# Patient Record
Sex: Male | Born: 1990 | Race: White | Hispanic: No | Smoking: Never smoker
Health system: Southern US, Community
[De-identification: ages and names within clinical notes are randomized; demographics above are authoritative.]

---

## 2016-05-10 ENCOUNTER — Other Ambulatory Visit: Payer: Self-pay | Admitting: Internal Medicine

## 2016-05-10 DIAGNOSIS — R945 Abnormal results of liver function studies: Secondary | ICD-10-CM

## 2016-05-17 ENCOUNTER — Other Ambulatory Visit: Payer: Self-pay

## 2016-05-19 ENCOUNTER — Other Ambulatory Visit: Payer: Self-pay

## 2016-05-24 ENCOUNTER — Ambulatory Visit
Admission: RE | Admit: 2016-05-24 | Discharge: 2016-05-24 | Disposition: A | Discharge status: Home / Self Care | Payer: BLUE CROSS/BLUE SHIELD | Source: Ambulatory Visit | Attending: Internal Medicine | Admitting: Internal Medicine

## 2016-05-24 DIAGNOSIS — R945 Abnormal results of liver function studies: Secondary | ICD-10-CM

## 2016-06-28 ENCOUNTER — Other Ambulatory Visit: Payer: Self-pay | Admitting: Internal Medicine

## 2016-06-28 DIAGNOSIS — R7989 Other specified abnormal findings of blood chemistry: Secondary | ICD-10-CM

## 2016-06-28 DIAGNOSIS — R945 Abnormal results of liver function studies: Secondary | ICD-10-CM

## 2016-12-03 ENCOUNTER — Ambulatory Visit (INDEPENDENT_AMBULATORY_CARE_PROVIDER_SITE_OTHER): Payer: BLUE CROSS/BLUE SHIELD | Admitting: Podiatry

## 2016-12-03 ENCOUNTER — Encounter: Payer: Self-pay | Admitting: Podiatry

## 2016-12-03 VITALS — BP 134/81 | HR 83 | Resp 16 | Ht 75.0 in | Wt 268.0 lb

## 2016-12-03 DIAGNOSIS — L600 Ingrowing nail: Secondary | ICD-10-CM | POA: Diagnosis not present

## 2016-12-03 NOTE — Progress Notes (Signed)
   Subjective:    Patient ID: Kristopher Stephens, male    DOB: Dec 11, 1990, 26 y.o.   MRN: [id number]  HPI 26 year old male presents the office if her concerns of ingrown toenail to the left big toe which is been ongoing the last 2 weeks. She's the areas painful with pressure in shoes he has noticed a small amount of pus. Denies any redness or red streaks. He has had no recent treatment for it. No other complaints.   Review of Systems  All other systems reviewed and are negative.      Objective:   Physical Exam General: AAO x3, NAD  Dermatological: There is incurvation along the lateral aspect of the left hallux toenail tenderness palpation. This mild hyper granulation tissue present. There is tenderness along the entire nail border. Faint amount of erythema there is no ascending synovitis. There is no drainage or pus I'm able to identify site. No ascending cellulitis. No open lesions.  Vascular: Dorsalis Pedis artery and Posterior Tibial artery pedal pulses are 2/4 bilateral with immedate capillary fill time. There is no pain with calf compression, swelling, warmth, erythema.   Neruologic: Grossly intact via light touch bilateral. Vibratory intact via tuning fork bilateral. Protective threshold with Semmes Wienstein monofilament intact to all pedal sites bilateral.  Musculoskeletal: No gross boney pedal deformities bilateral. No pain, crepitus, or limitation noted with foot and ankle range of motion bilateral. Muscular strength 5/5 in all groups tested bilateral.  Gait: Unassisted, Nonantalgic.     Assessment & Plan:  26 year old male left lateral hallux ingrown toenail -Treatment options discussed including all alternatives, risks, and complications -Etiology of symptoms were discussed -At this time, the patient is requesting partial nail removal with chemical matricectomy to the symptomatic portion of the nail. Risks and complications were discussed with the patient for which they  understand and  verbally consent to the procedure. Under sterile conditions a total of 3 mL of a mixture of 2% lidocaine plain and 0.5% Marcaine plain was infiltrated in a hallux block fashion. Once anesthetized, the skin was prepped in sterile fashion. A tourniquet was then applied. Next the lateral aspect of hallux nail border was then sharply excised making sure to remove the entire offending nail border. Once the nails were ensured to be removed area was debrided and the underlying skin was intact. There is no purulence identified in the procedure. Next phenol was then applied under standard conditions and copiously irrigated. Silvadene was applied. A dry sterile dressing was applied. After application of the dressing the tourniquet was removed and there is found to be an immediate capillary refill time to the digit. The patient tolerated the procedure well any complications. Post procedure instructions were discussed the patient for which he verbally understood. Follow-up in one week for nail check or sooner if any problems are to arise. Discussed signs/symptoms of infection and directed to call the office immediately should any occur or go directly to the emergency room. In the meantime, encouraged to call the office with any questions, concerns, changes symptoms.  Kristopher Stephens, DPM

## 2016-12-03 NOTE — Patient Instructions (Signed)

## 2018-02-06 IMAGING — US US ABDOMEN COMPLETE
1 series · 14 of 25 positions shown · non-contrast
Comparison: None.

CLINICAL DATA: Elevated liver function tests.

EXAM:
ABDOMEN ULTRASOUND COMPLETE

[Series 1: us abdomen complete · 0.24mm/px · 14 of 77 slices shown]
[im 1/77]
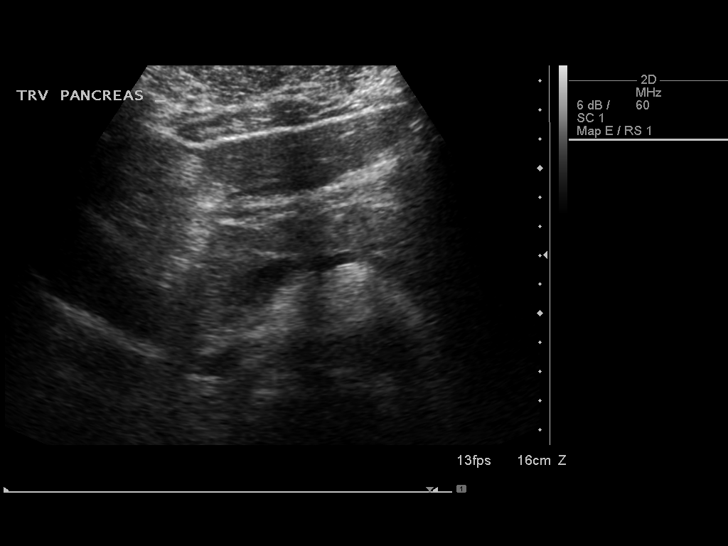
[im 7/77]
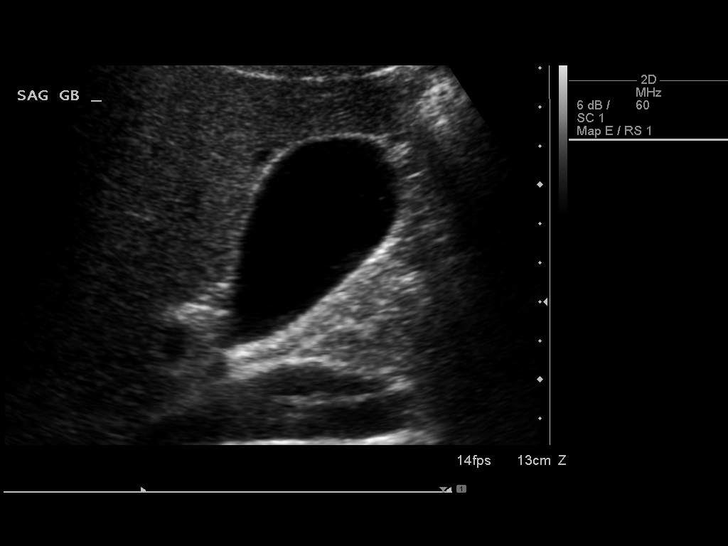
[im 13/77]
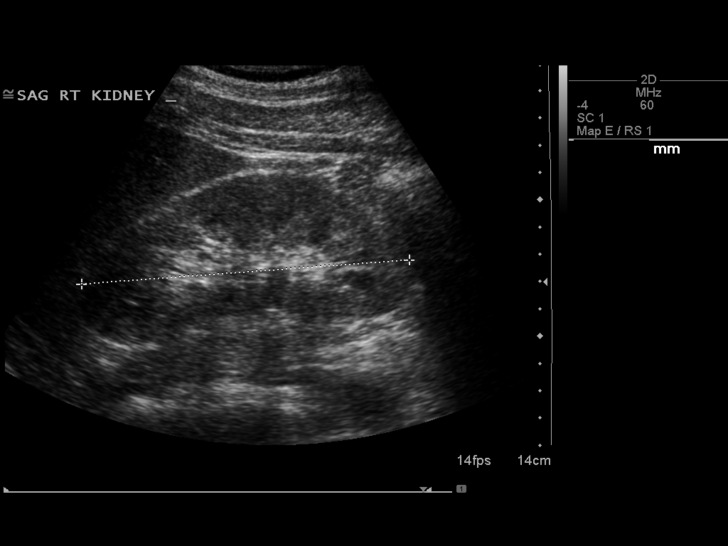
[im 20/77]
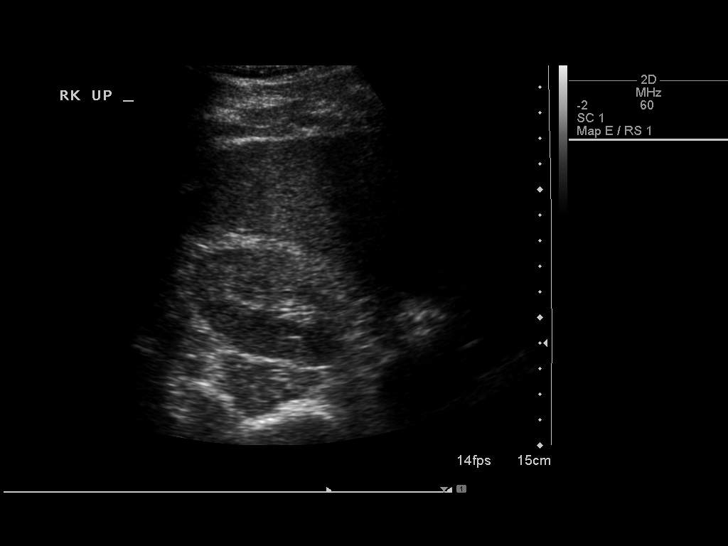
[im 26/77]
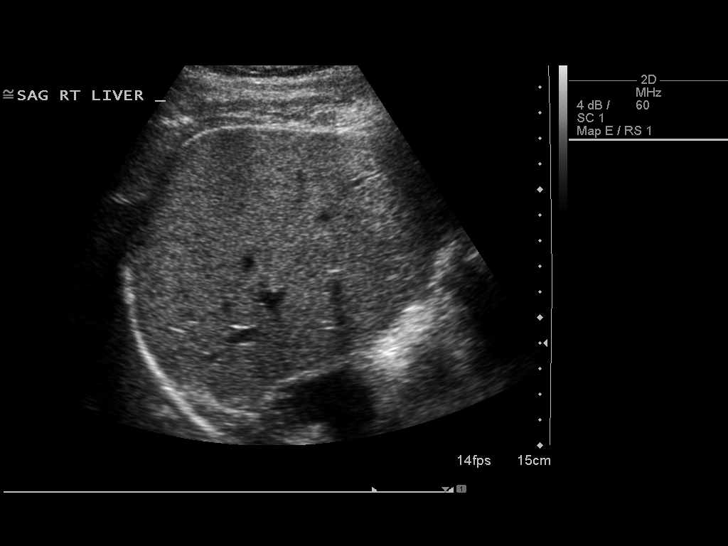
[im 29/77]
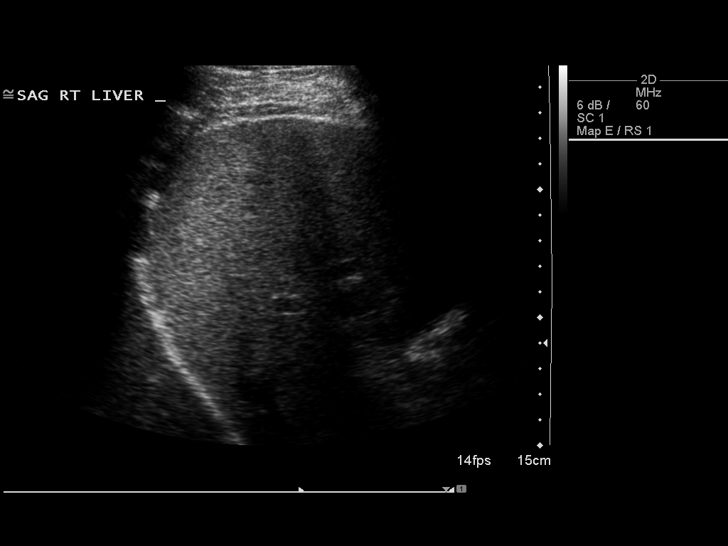
[im 35/77]
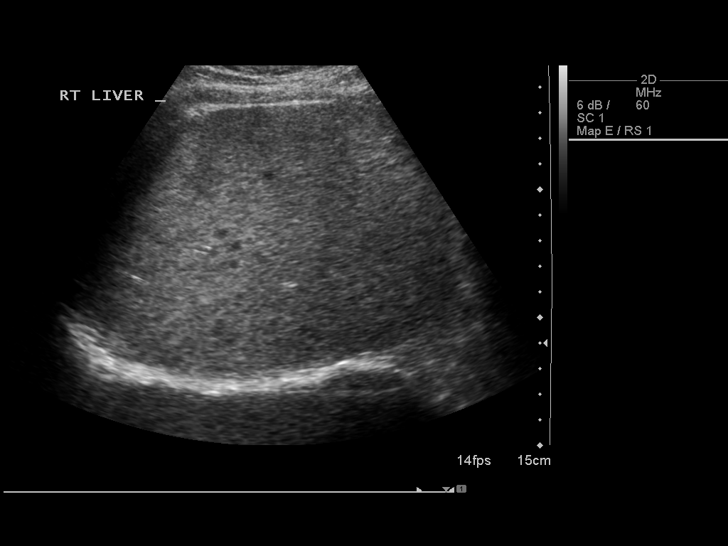
[im 42/77]
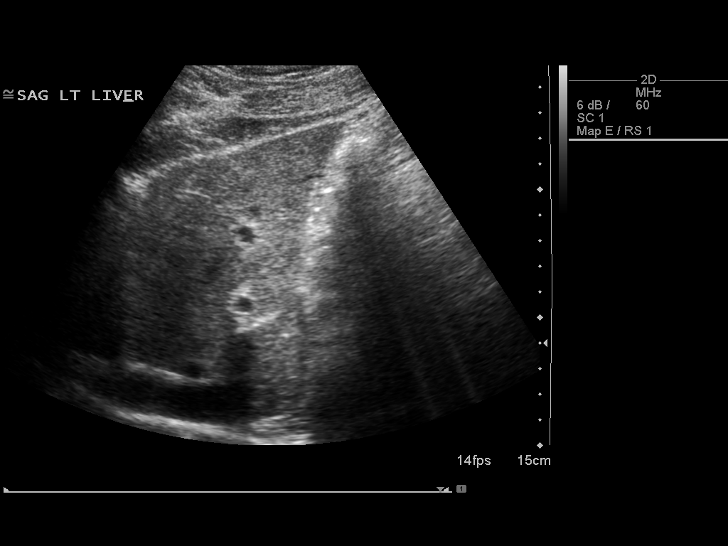
[im 48/77]
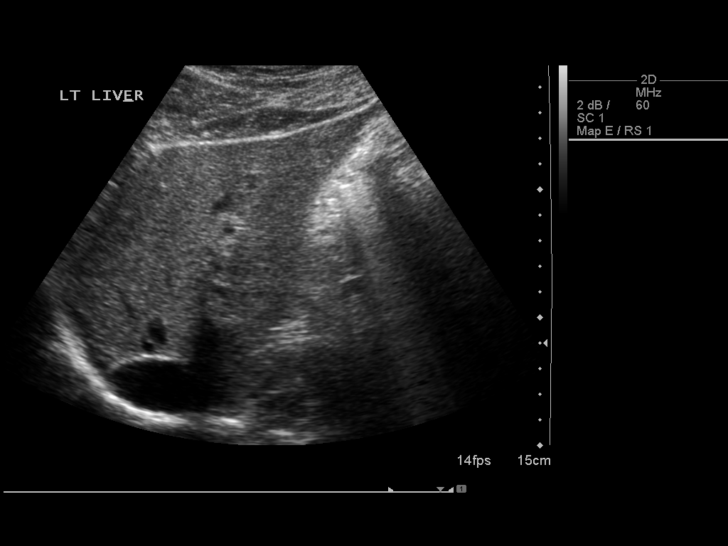
[im 51/77]
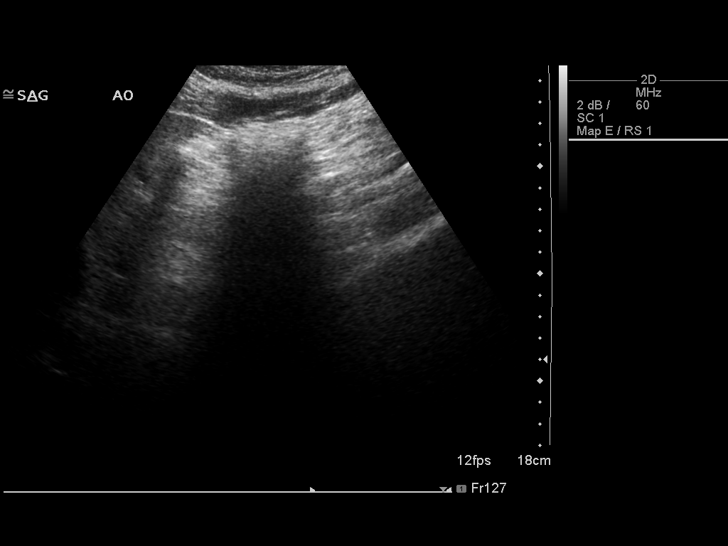
[im 58/77]
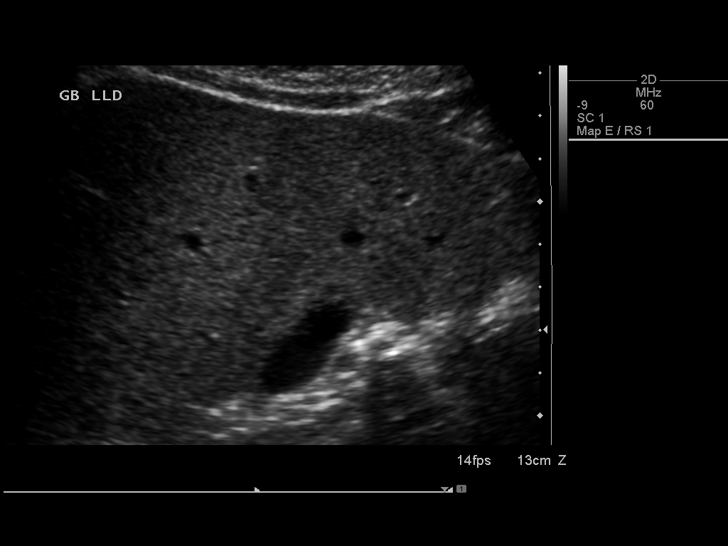
[im 64/77]
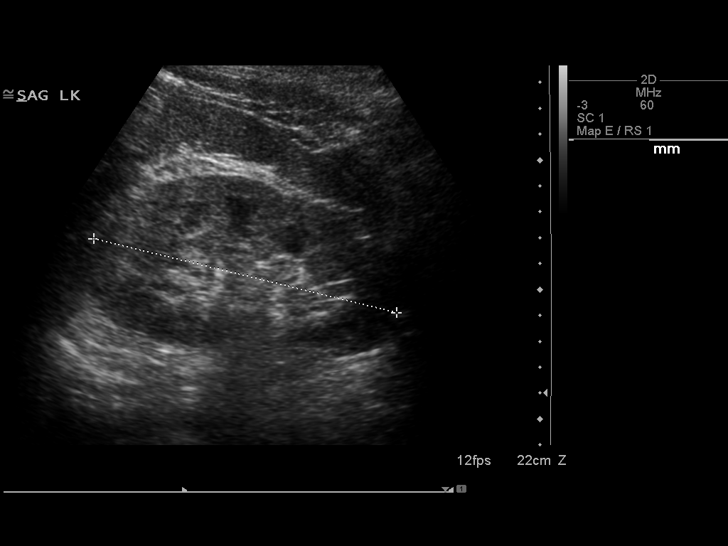
[im 70/77]
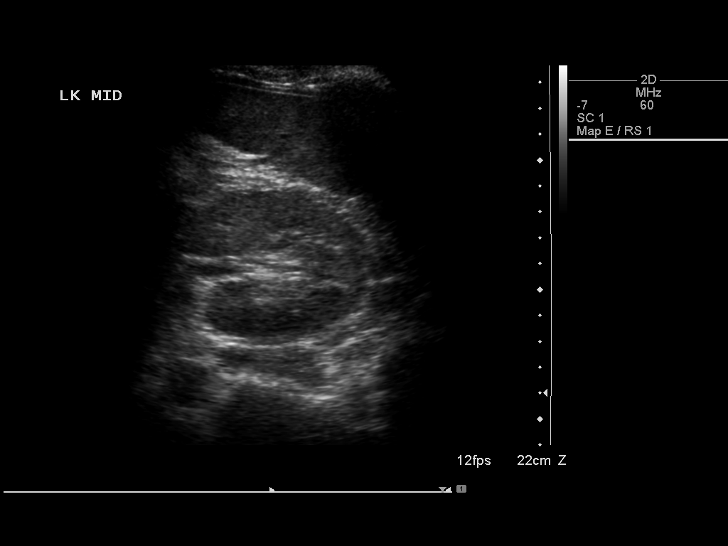
[im 77/77]
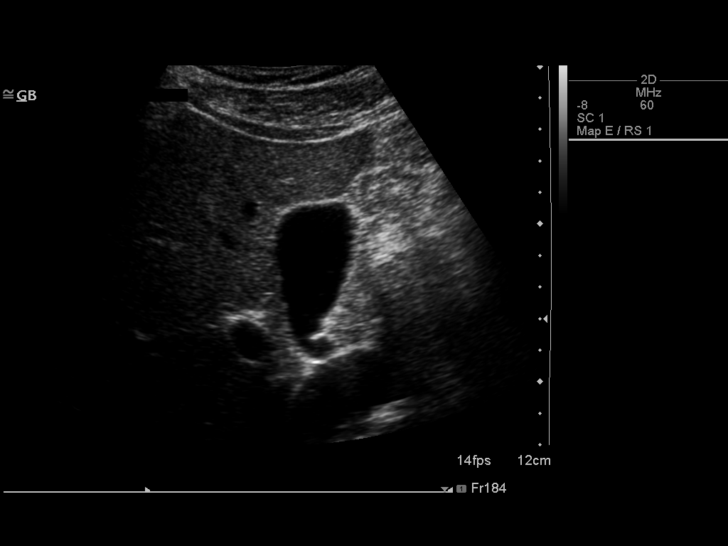

[14 of 25 positions shown; findings below may reference images not displayed]

FINDINGS: Gallbladder: No gallstones or wall thickening visualized. No
sonographic Murphy sign noted by sonographer.

Common bile duct: Diameter: 5.2 mm

Liver: Heterogeneous echogenicity with generalized increased
echogenicity. No mass or focal lesion.

IVC: No abnormality visualized.

Pancreas: Portions visualized are unremarkable. Tail obscured by
bowel gas.

Spleen: Size and appearance within normal limits.

Right Kidney: Length: 13.0 cm. Echogenicity within normal limits. No
mass or hydronephrosis visualized.

Left Kidney: Length: 12.3 cm. Echogenicity within normal limits. No
mass or hydronephrosis visualized.

Abdominal aorta: No aneurysm visualized.

Other findings: None.
IMPRESSION: 1. No acute findings.  Normal gallbladder.  No bile duct dilation.
2. Heterogeneous echogenicity of the liver with generalized
increased echogenicity. This is most likely due to hepatic
steatosis.
3. No other abnormalities.

## 2023-03-24 DIAGNOSIS — M25511 Pain in right shoulder: Secondary | ICD-10-CM | POA: Diagnosis not present

## 2023-04-01 DIAGNOSIS — R945 Abnormal results of liver function studies: Secondary | ICD-10-CM | POA: Diagnosis not present

## 2023-04-01 DIAGNOSIS — Z1331 Encounter for screening for depression: Secondary | ICD-10-CM | POA: Diagnosis not present

## 2023-04-01 DIAGNOSIS — K219 Gastro-esophageal reflux disease without esophagitis: Secondary | ICD-10-CM | POA: Diagnosis not present

## 2023-04-05 ENCOUNTER — Ambulatory Visit: Payer: BLUE CROSS/BLUE SHIELD | Admitting: Family Medicine

## 2023-04-06 ENCOUNTER — Ambulatory Visit: Payer: BLUE CROSS/BLUE SHIELD | Admitting: Family Medicine

## 2023-04-13 DIAGNOSIS — Z23 Encounter for immunization: Secondary | ICD-10-CM | POA: Diagnosis not present

## 2023-04-18 DIAGNOSIS — F419 Anxiety disorder, unspecified: Secondary | ICD-10-CM | POA: Diagnosis not present

## 2023-04-22 DIAGNOSIS — F4323 Adjustment disorder with mixed anxiety and depressed mood: Secondary | ICD-10-CM | POA: Diagnosis not present

## 2023-04-29 DIAGNOSIS — F4323 Adjustment disorder with mixed anxiety and depressed mood: Secondary | ICD-10-CM | POA: Diagnosis not present

## 2023-05-03 DIAGNOSIS — F4323 Adjustment disorder with mixed anxiety and depressed mood: Secondary | ICD-10-CM | POA: Diagnosis not present

## 2023-05-09 DIAGNOSIS — F419 Anxiety disorder, unspecified: Secondary | ICD-10-CM | POA: Diagnosis not present

## 2023-05-11 DIAGNOSIS — F4323 Adjustment disorder with mixed anxiety and depressed mood: Secondary | ICD-10-CM | POA: Diagnosis not present

## 2023-05-20 DIAGNOSIS — F4323 Adjustment disorder with mixed anxiety and depressed mood: Secondary | ICD-10-CM | POA: Diagnosis not present

## 2023-05-27 DIAGNOSIS — F4323 Adjustment disorder with mixed anxiety and depressed mood: Secondary | ICD-10-CM | POA: Diagnosis not present

## 2023-05-30 DIAGNOSIS — F4323 Adjustment disorder with mixed anxiety and depressed mood: Secondary | ICD-10-CM | POA: Diagnosis not present

## 2023-06-03 DIAGNOSIS — F4323 Adjustment disorder with mixed anxiety and depressed mood: Secondary | ICD-10-CM | POA: Diagnosis not present

## 2023-06-08 DIAGNOSIS — F4323 Adjustment disorder with mixed anxiety and depressed mood: Secondary | ICD-10-CM | POA: Diagnosis not present

## 2023-06-17 DIAGNOSIS — F4323 Adjustment disorder with mixed anxiety and depressed mood: Secondary | ICD-10-CM | POA: Diagnosis not present

## 2023-06-21 DIAGNOSIS — J0100 Acute maxillary sinusitis, unspecified: Secondary | ICD-10-CM | POA: Diagnosis not present

## 2023-06-22 DIAGNOSIS — F4323 Adjustment disorder with mixed anxiety and depressed mood: Secondary | ICD-10-CM | POA: Diagnosis not present

## 2023-06-28 DIAGNOSIS — F4323 Adjustment disorder with mixed anxiety and depressed mood: Secondary | ICD-10-CM | POA: Diagnosis not present

## 2023-07-01 DIAGNOSIS — F4323 Adjustment disorder with mixed anxiety and depressed mood: Secondary | ICD-10-CM | POA: Diagnosis not present

## 2023-07-08 DIAGNOSIS — F4323 Adjustment disorder with mixed anxiety and depressed mood: Secondary | ICD-10-CM | POA: Diagnosis not present

## 2023-07-15 DIAGNOSIS — F4323 Adjustment disorder with mixed anxiety and depressed mood: Secondary | ICD-10-CM | POA: Diagnosis not present

## 2023-07-19 DIAGNOSIS — F4323 Adjustment disorder with mixed anxiety and depressed mood: Secondary | ICD-10-CM | POA: Diagnosis not present

## 2023-07-25 DIAGNOSIS — F4323 Adjustment disorder with mixed anxiety and depressed mood: Secondary | ICD-10-CM | POA: Diagnosis not present

## 2023-08-08 DIAGNOSIS — F4323 Adjustment disorder with mixed anxiety and depressed mood: Secondary | ICD-10-CM | POA: Diagnosis not present

## 2023-08-09 DIAGNOSIS — F4323 Adjustment disorder with mixed anxiety and depressed mood: Secondary | ICD-10-CM | POA: Diagnosis not present

## 2023-08-12 DIAGNOSIS — F4323 Adjustment disorder with mixed anxiety and depressed mood: Secondary | ICD-10-CM | POA: Diagnosis not present

## 2023-08-18 DIAGNOSIS — M25511 Pain in right shoulder: Secondary | ICD-10-CM | POA: Diagnosis not present

## 2023-08-19 DIAGNOSIS — F4323 Adjustment disorder with mixed anxiety and depressed mood: Secondary | ICD-10-CM | POA: Diagnosis not present

## 2023-08-26 DIAGNOSIS — F4323 Adjustment disorder with mixed anxiety and depressed mood: Secondary | ICD-10-CM | POA: Diagnosis not present

## 2023-08-31 DIAGNOSIS — F4323 Adjustment disorder with mixed anxiety and depressed mood: Secondary | ICD-10-CM | POA: Diagnosis not present

## 2023-09-06 DIAGNOSIS — F4323 Adjustment disorder with mixed anxiety and depressed mood: Secondary | ICD-10-CM | POA: Diagnosis not present

## 2023-09-09 DIAGNOSIS — M25511 Pain in right shoulder: Secondary | ICD-10-CM | POA: Diagnosis not present

## 2023-09-14 DIAGNOSIS — F4323 Adjustment disorder with mixed anxiety and depressed mood: Secondary | ICD-10-CM | POA: Diagnosis not present

## 2023-09-15 DIAGNOSIS — M75101 Unspecified rotator cuff tear or rupture of right shoulder, not specified as traumatic: Secondary | ICD-10-CM | POA: Diagnosis not present

## 2023-09-28 DIAGNOSIS — F4323 Adjustment disorder with mixed anxiety and depressed mood: Secondary | ICD-10-CM | POA: Diagnosis not present

## 2023-09-30 DIAGNOSIS — F4323 Adjustment disorder with mixed anxiety and depressed mood: Secondary | ICD-10-CM | POA: Diagnosis not present

## 2023-10-04 DIAGNOSIS — A499 Bacterial infection, unspecified: Secondary | ICD-10-CM | POA: Diagnosis not present

## 2023-10-05 DIAGNOSIS — F4323 Adjustment disorder with mixed anxiety and depressed mood: Secondary | ICD-10-CM | POA: Diagnosis not present

## 2023-10-06 DIAGNOSIS — F4323 Adjustment disorder with mixed anxiety and depressed mood: Secondary | ICD-10-CM | POA: Diagnosis not present

## 2023-10-13 DIAGNOSIS — F4323 Adjustment disorder with mixed anxiety and depressed mood: Secondary | ICD-10-CM | POA: Diagnosis not present

## 2023-10-19 DIAGNOSIS — F4323 Adjustment disorder with mixed anxiety and depressed mood: Secondary | ICD-10-CM | POA: Diagnosis not present

## 2023-10-26 DIAGNOSIS — F4323 Adjustment disorder with mixed anxiety and depressed mood: Secondary | ICD-10-CM | POA: Diagnosis not present

## 2023-11-02 DIAGNOSIS — F4323 Adjustment disorder with mixed anxiety and depressed mood: Secondary | ICD-10-CM | POA: Diagnosis not present

## 2023-11-09 DIAGNOSIS — F4323 Adjustment disorder with mixed anxiety and depressed mood: Secondary | ICD-10-CM | POA: Diagnosis not present

## 2023-11-16 DIAGNOSIS — F4323 Adjustment disorder with mixed anxiety and depressed mood: Secondary | ICD-10-CM | POA: Diagnosis not present

## 2023-11-23 DIAGNOSIS — F4323 Adjustment disorder with mixed anxiety and depressed mood: Secondary | ICD-10-CM | POA: Diagnosis not present

## 2023-11-28 DIAGNOSIS — F4323 Adjustment disorder with mixed anxiety and depressed mood: Secondary | ICD-10-CM | POA: Diagnosis not present

## 2023-12-06 DIAGNOSIS — M25511 Pain in right shoulder: Secondary | ICD-10-CM | POA: Diagnosis not present

## 2023-12-07 DIAGNOSIS — F4323 Adjustment disorder with mixed anxiety and depressed mood: Secondary | ICD-10-CM | POA: Diagnosis not present

## 2023-12-14 DIAGNOSIS — F4323 Adjustment disorder with mixed anxiety and depressed mood: Secondary | ICD-10-CM | POA: Diagnosis not present

## 2023-12-15 DIAGNOSIS — F4323 Adjustment disorder with mixed anxiety and depressed mood: Secondary | ICD-10-CM | POA: Diagnosis not present

## 2023-12-21 DIAGNOSIS — F4323 Adjustment disorder with mixed anxiety and depressed mood: Secondary | ICD-10-CM | POA: Diagnosis not present

## 2023-12-30 DIAGNOSIS — F4323 Adjustment disorder with mixed anxiety and depressed mood: Secondary | ICD-10-CM | POA: Diagnosis not present

## 2024-01-04 DIAGNOSIS — F4323 Adjustment disorder with mixed anxiety and depressed mood: Secondary | ICD-10-CM | POA: Diagnosis not present

## 2024-01-11 DIAGNOSIS — F4323 Adjustment disorder with mixed anxiety and depressed mood: Secondary | ICD-10-CM | POA: Diagnosis not present

## 2024-01-17 DIAGNOSIS — F4323 Adjustment disorder with mixed anxiety and depressed mood: Secondary | ICD-10-CM | POA: Diagnosis not present

## 2024-01-18 DIAGNOSIS — F4323 Adjustment disorder with mixed anxiety and depressed mood: Secondary | ICD-10-CM | POA: Diagnosis not present

## 2024-01-25 DIAGNOSIS — F4323 Adjustment disorder with mixed anxiety and depressed mood: Secondary | ICD-10-CM | POA: Diagnosis not present

## 2024-02-01 DIAGNOSIS — F4323 Adjustment disorder with mixed anxiety and depressed mood: Secondary | ICD-10-CM | POA: Diagnosis not present

## 2024-02-07 DIAGNOSIS — F4323 Adjustment disorder with mixed anxiety and depressed mood: Secondary | ICD-10-CM | POA: Diagnosis not present

## 2024-02-08 DIAGNOSIS — F4323 Adjustment disorder with mixed anxiety and depressed mood: Secondary | ICD-10-CM | POA: Diagnosis not present

## 2024-02-15 DIAGNOSIS — F4323 Adjustment disorder with mixed anxiety and depressed mood: Secondary | ICD-10-CM | POA: Diagnosis not present

## 2024-02-22 DIAGNOSIS — F4323 Adjustment disorder with mixed anxiety and depressed mood: Secondary | ICD-10-CM | POA: Diagnosis not present

## 2024-02-29 DIAGNOSIS — F4323 Adjustment disorder with mixed anxiety and depressed mood: Secondary | ICD-10-CM | POA: Diagnosis not present

## 2024-03-07 DIAGNOSIS — F4323 Adjustment disorder with mixed anxiety and depressed mood: Secondary | ICD-10-CM | POA: Diagnosis not present

## 2024-03-14 DIAGNOSIS — F4323 Adjustment disorder with mixed anxiety and depressed mood: Secondary | ICD-10-CM | POA: Diagnosis not present

## 2024-03-15 DIAGNOSIS — F4323 Adjustment disorder with mixed anxiety and depressed mood: Secondary | ICD-10-CM | POA: Diagnosis not present

## 2024-03-21 DIAGNOSIS — F4323 Adjustment disorder with mixed anxiety and depressed mood: Secondary | ICD-10-CM | POA: Diagnosis not present

## 2024-04-02 DIAGNOSIS — Z1389 Encounter for screening for other disorder: Secondary | ICD-10-CM | POA: Diagnosis not present

## 2024-04-02 DIAGNOSIS — E291 Testicular hypofunction: Secondary | ICD-10-CM | POA: Diagnosis not present

## 2024-04-03 DIAGNOSIS — K219 Gastro-esophageal reflux disease without esophagitis: Secondary | ICD-10-CM | POA: Diagnosis not present

## 2024-04-04 DIAGNOSIS — F4323 Adjustment disorder with mixed anxiety and depressed mood: Secondary | ICD-10-CM | POA: Diagnosis not present

## 2024-04-06 DIAGNOSIS — Z23 Encounter for immunization: Secondary | ICD-10-CM | POA: Diagnosis not present

## 2024-04-09 DIAGNOSIS — Z1389 Encounter for screening for other disorder: Secondary | ICD-10-CM | POA: Diagnosis not present

## 2024-04-09 DIAGNOSIS — Z1331 Encounter for screening for depression: Secondary | ICD-10-CM | POA: Diagnosis not present

## 2024-04-09 DIAGNOSIS — K219 Gastro-esophageal reflux disease without esophagitis: Secondary | ICD-10-CM | POA: Diagnosis not present

## 2024-04-09 DIAGNOSIS — Z23 Encounter for immunization: Secondary | ICD-10-CM | POA: Diagnosis not present

## 2024-04-09 DIAGNOSIS — Z0000 Encounter for general adult medical examination without abnormal findings: Secondary | ICD-10-CM | POA: Diagnosis not present

## 2024-04-09 DIAGNOSIS — R82998 Other abnormal findings in urine: Secondary | ICD-10-CM | POA: Diagnosis not present

## 2024-04-11 DIAGNOSIS — F4323 Adjustment disorder with mixed anxiety and depressed mood: Secondary | ICD-10-CM | POA: Diagnosis not present

## 2024-04-19 DIAGNOSIS — F4323 Adjustment disorder with mixed anxiety and depressed mood: Secondary | ICD-10-CM | POA: Diagnosis not present

## 2024-04-25 DIAGNOSIS — F4323 Adjustment disorder with mixed anxiety and depressed mood: Secondary | ICD-10-CM | POA: Diagnosis not present

## 2024-05-02 DIAGNOSIS — F4323 Adjustment disorder with mixed anxiety and depressed mood: Secondary | ICD-10-CM | POA: Diagnosis not present

## 2024-05-03 DIAGNOSIS — F4323 Adjustment disorder with mixed anxiety and depressed mood: Secondary | ICD-10-CM | POA: Diagnosis not present

## 2024-05-17 DIAGNOSIS — F4323 Adjustment disorder with mixed anxiety and depressed mood: Secondary | ICD-10-CM | POA: Diagnosis not present

## 2024-05-23 DIAGNOSIS — F4323 Adjustment disorder with mixed anxiety and depressed mood: Secondary | ICD-10-CM | POA: Diagnosis not present

## 2024-05-30 DIAGNOSIS — F4323 Adjustment disorder with mixed anxiety and depressed mood: Secondary | ICD-10-CM | POA: Diagnosis not present

## 2024-07-04 DIAGNOSIS — G8929 Other chronic pain: Secondary | ICD-10-CM | POA: Diagnosis not present

## 2024-07-04 DIAGNOSIS — M25511 Pain in right shoulder: Secondary | ICD-10-CM | POA: Diagnosis not present
